# Patient Record
Sex: Male | Born: 1965 | Race: White | Hispanic: No | Marital: Single | Smoking: Never smoker
Health system: Southern US, Community
[De-identification: ages and names within clinical notes are randomized; demographics above are authoritative.]

---

## 2013-07-17 ENCOUNTER — Emergency Department: Payer: Self-pay | Admitting: Emergency Medicine

## 2014-12-06 IMAGING — CR DG CHEST 2V
1 series · 2 of 2 positions shown · non-contrast
Comparison: None.

CLINICAL DATA: Sinus congestion and productive cough; right-sided
rib pain.

EXAM:
CHEST  2 VIEW

[Series 1: w chest pa · 0.14mm/px · 2 of 2 slices shown]
[im 1/2]
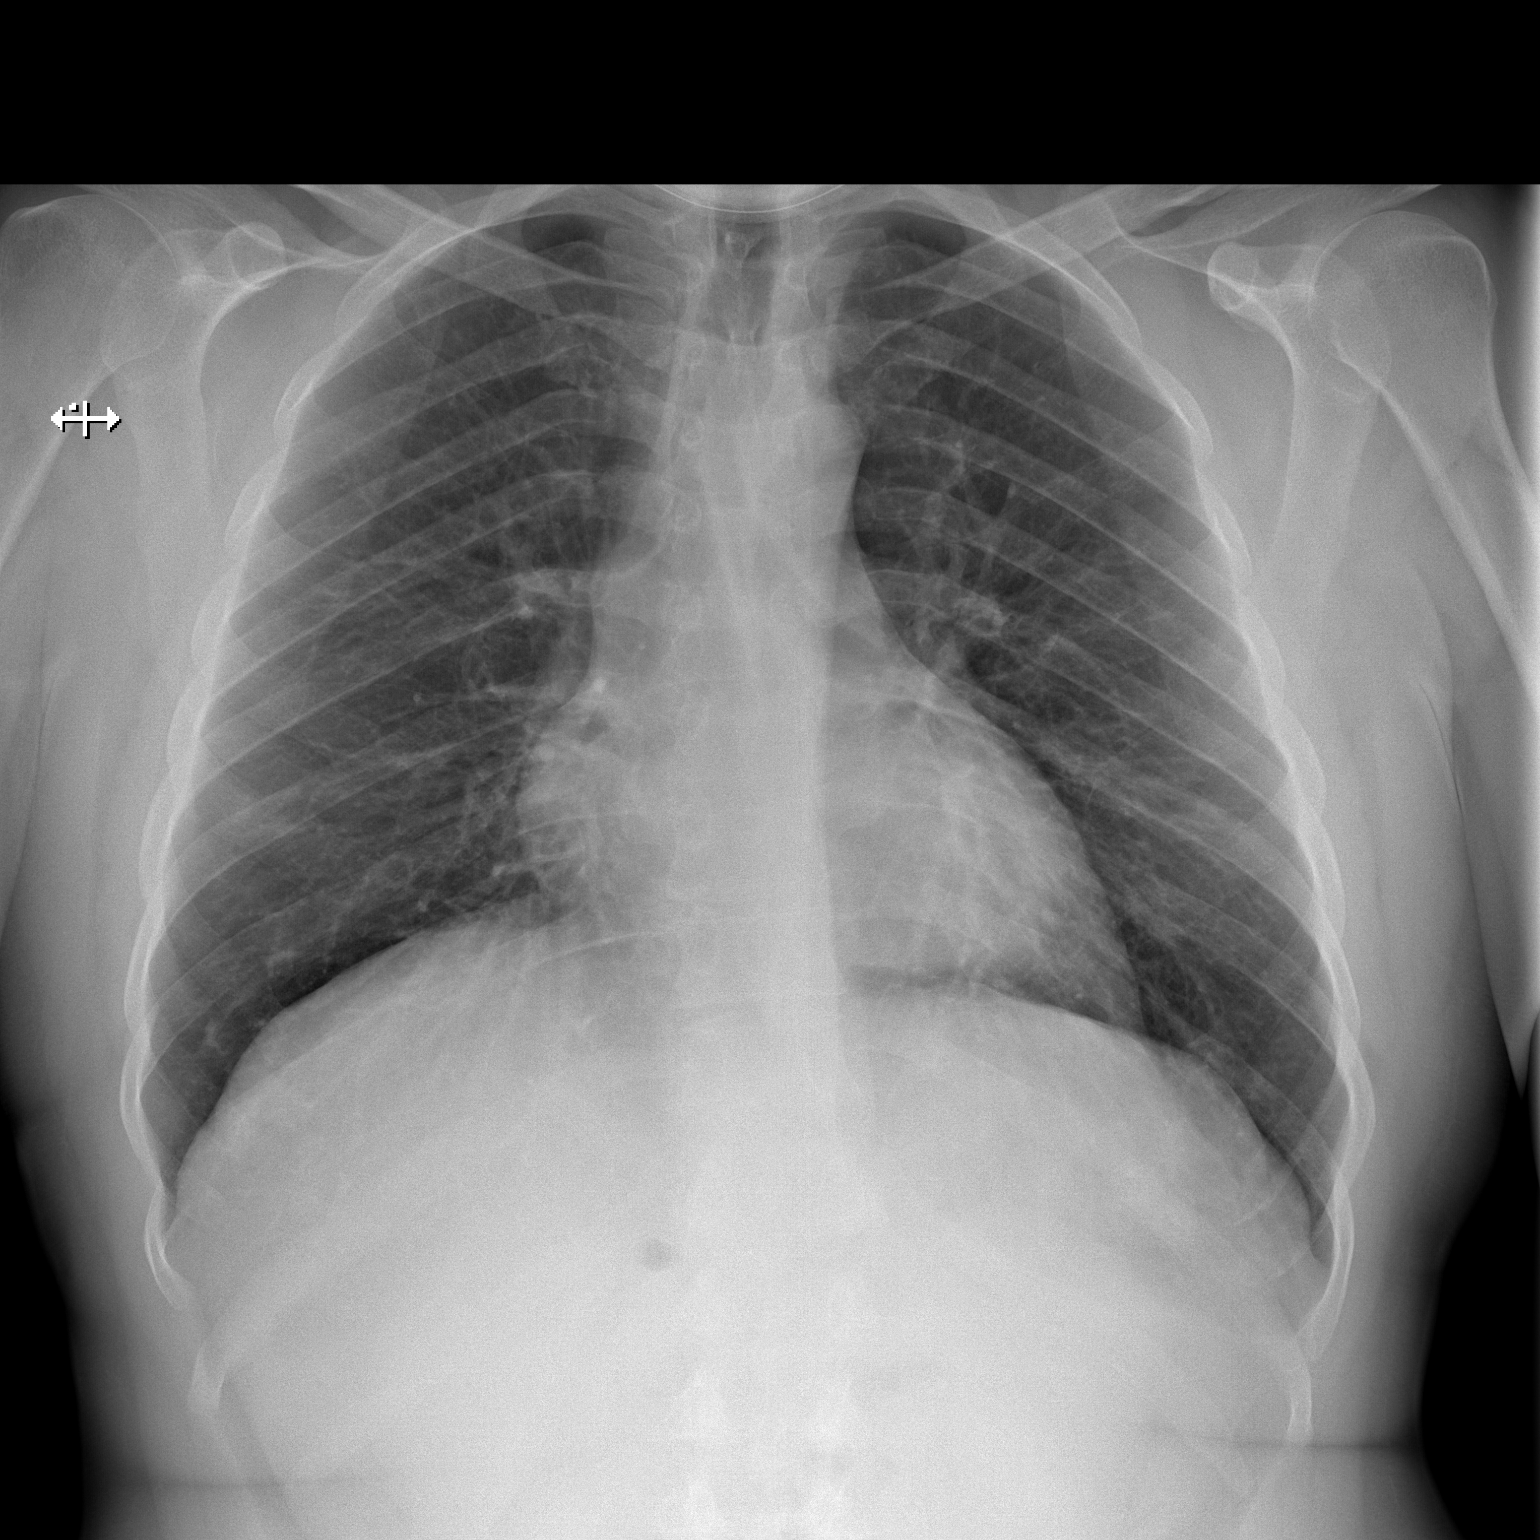
[im 2/2]
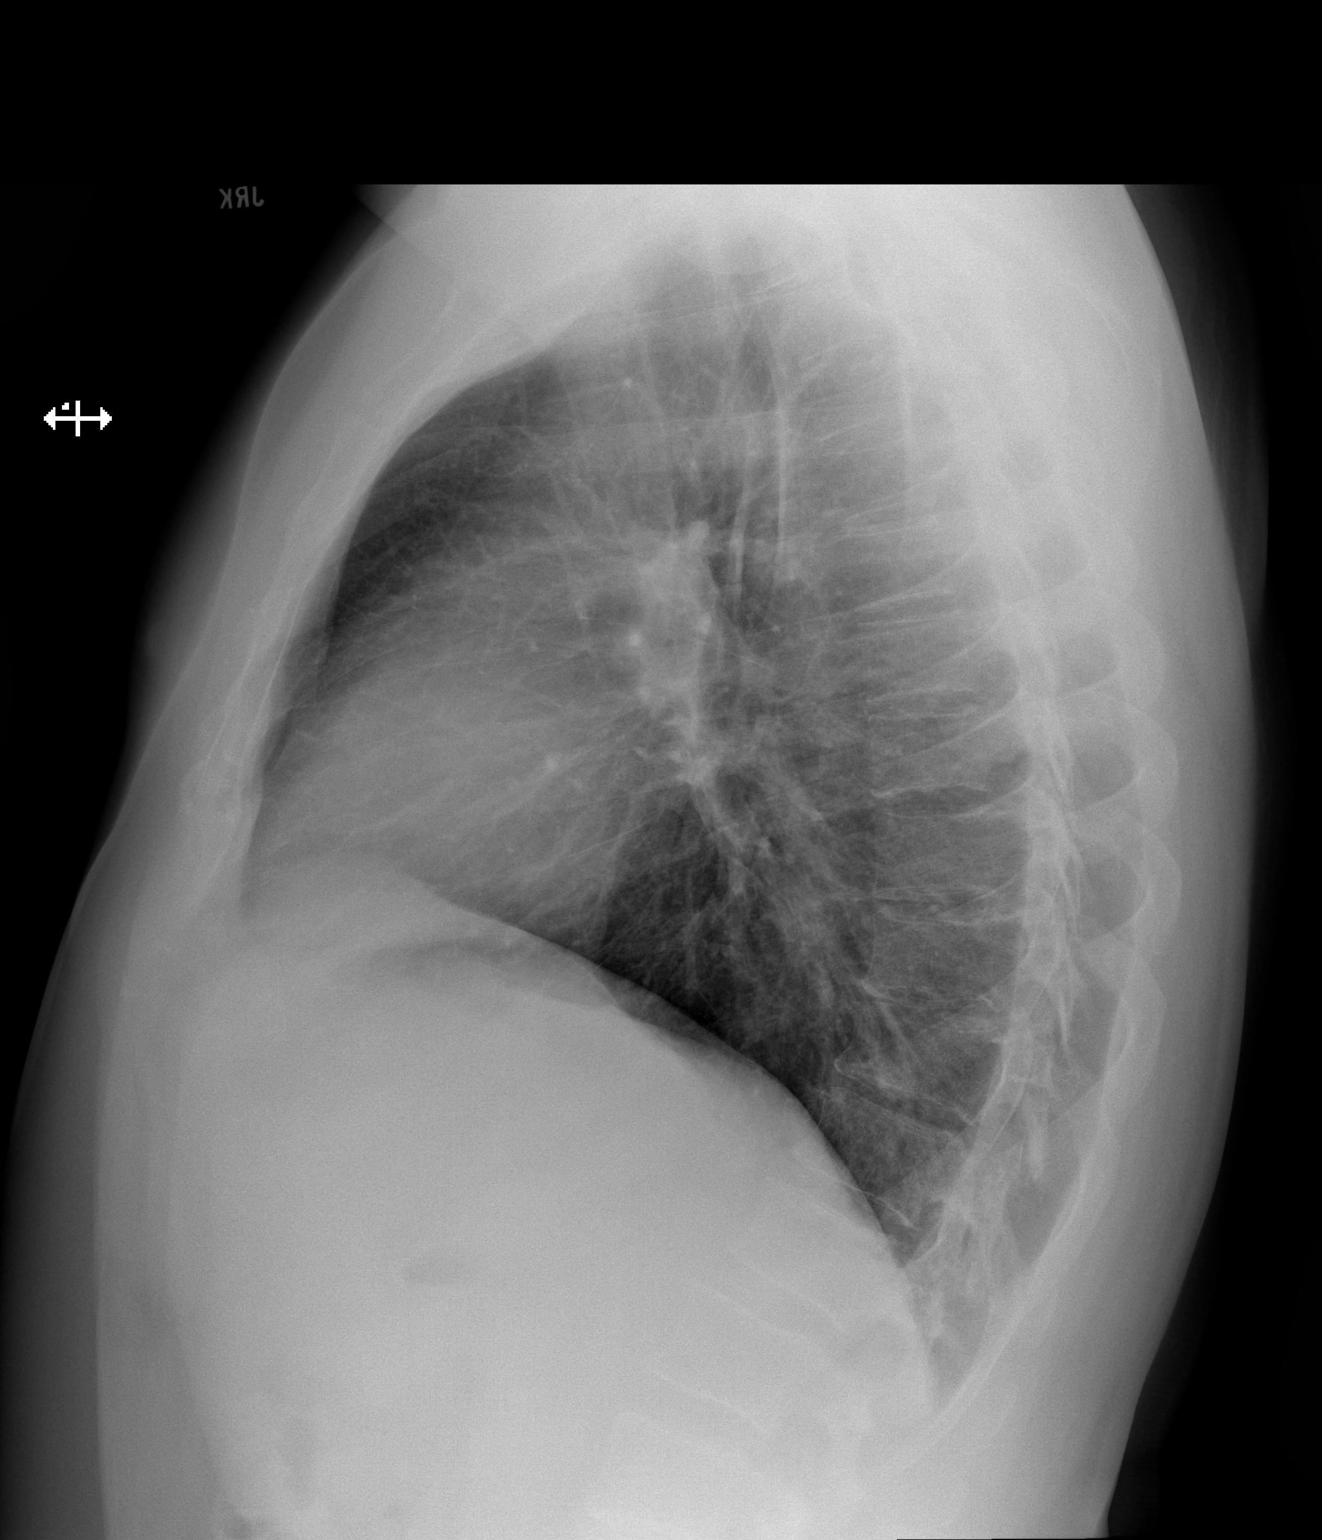

[2 of 2 positions shown; findings below may reference images not displayed]

FINDINGS: The lungs are well-aerated and clear. There is no evidence of focal
opacification, pleural effusion or pneumothorax.

The heart is normal in size; the mediastinal contour is within
normal limits. No acute osseous abnormalities are seen.
IMPRESSION: No acute cardiopulmonary process seen.

## 2019-02-05 ENCOUNTER — Emergency Department: Payer: Self-pay

## 2019-02-05 ENCOUNTER — Emergency Department
Admission: EM | Admit: 2019-02-05 | Discharge: 2019-02-05 | Disposition: A | Discharge status: Home / Self Care | Payer: Self-pay | Attending: Student in an Organized Health Care Education/Training Program | Admitting: Student in an Organized Health Care Education/Training Program

## 2019-02-05 ENCOUNTER — Other Ambulatory Visit: Payer: Self-pay

## 2019-02-05 DIAGNOSIS — Z79899 Other long term (current) drug therapy: Secondary | ICD-10-CM | POA: Insufficient documentation

## 2019-02-05 DIAGNOSIS — G510 Bell's palsy: Secondary | ICD-10-CM | POA: Insufficient documentation

## 2019-02-05 LAB — COMPREHENSIVE METABOLIC PANEL
ALT: 45 U/L — ABNORMAL HIGH (ref 0–44)
AST: 24 U/L (ref 15–41)
Albumin: 4.4 g/dL (ref 3.5–5.0)
Alkaline Phosphatase: 53 U/L (ref 38–126)
Anion gap: 10 (ref 5–15)
BUN: 22 mg/dL — ABNORMAL HIGH (ref 6–20)
CO2: 23 mmol/L (ref 22–32)
Calcium: 9 mg/dL (ref 8.9–10.3)
Chloride: 104 mmol/L (ref 98–111)
Creatinine, Ser: 1.23 mg/dL (ref 0.61–1.24)
GFR calc Af Amer: >60 mL/min (ref >60)
GFR calc non Af Amer: >60 mL/min (ref >60)
Glucose, Bld: 115 mg/dL — ABNORMAL HIGH (ref 70–99)
Potassium: 3.9 mmol/L (ref 3.5–5.1)
Sodium: 137 mmol/L (ref 135–145)
Total Bilirubin: 0.7 mg/dL (ref 0.3–1.2)
Total Protein: 7.7 g/dL (ref 6.5–8.1)

## 2019-02-05 LAB — ETHANOL: Alcohol, Ethyl (B): <10 mg/dL (ref <10)

## 2019-02-05 LAB — URINALYSIS, ROUTINE W REFLEX MICROSCOPIC
Bilirubin Urine: NEGATIVE
Glucose, UA: NEGATIVE mg/dL
Hgb urine dipstick: NEGATIVE
Ketones, ur: NEGATIVE mg/dL
Leukocytes,Ua: NEGATIVE
Nitrite: NEGATIVE
Protein, ur: NEGATIVE mg/dL
Specific Gravity, Urine: 1.024 (ref 1.005–1.030)
pH: 5 (ref 5.0–8.0)

## 2019-02-05 LAB — URINE DRUG SCREEN, QUALITATIVE (ARMC ONLY)
Amphetamines, Ur Screen: NOT DETECTED
Barbiturates, Ur Screen: NOT DETECTED
Benzodiazepine, Ur Scrn: NOT DETECTED
Cannabinoid 50 Ng, Ur ~~LOC~~: NOT DETECTED
Cocaine Metabolite,Ur ~~LOC~~: NOT DETECTED
MDMA (Ecstasy)Ur Screen: NOT DETECTED
Methadone Scn, Ur: NOT DETECTED
Opiate, Ur Screen: NOT DETECTED
Phencyclidine (PCP) Ur S: NOT DETECTED
Tricyclic, Ur Screen: NOT DETECTED

## 2019-02-05 LAB — DIFFERENTIAL
Abs Immature Granulocytes: 0.04 10*3/uL (ref 0.00–0.07)
Basophils Absolute: 0.1 10*3/uL (ref 0.0–0.1)
Basophils Relative: 1 %
Eosinophils Absolute: 0.3 10*3/uL (ref 0.0–0.5)
Eosinophils Relative: 3 %
Immature Granulocytes: 0 %
Lymphocytes Relative: 19 %
Lymphs Abs: 1.8 10*3/uL (ref 0.7–4.0)
Monocytes Absolute: 0.7 10*3/uL (ref 0.1–1.0)
Monocytes Relative: 7 %
Neutro Abs: 6.7 10*3/uL (ref 1.7–7.7)
Neutrophils Relative %: 70 %

## 2019-02-05 LAB — PROTIME-INR
INR: 0.9 (ref 0.8–1.2)
Prothrombin Time: 12 seconds (ref 11.4–15.2)

## 2019-02-05 LAB — CBC
HCT: 45.2 % (ref 39.0–52.0)
Hemoglobin: 15.3 g/dL (ref 13.0–17.0)
MCH: 29.8 pg (ref 26.0–34.0)
MCHC: 33.8 g/dL (ref 30.0–36.0)
MCV: 88.1 fL (ref 80.0–100.0)
Platelets: 328 10*3/uL (ref 150–400)
RBC: 5.13 MIL/uL (ref 4.22–5.81)
RDW: 13.3 % (ref 11.5–15.5)
WBC: 9.6 10*3/uL (ref 4.0–10.5)
nRBC: 0 % (ref 0.0–0.2)

## 2019-02-05 LAB — APTT: aPTT: 26 seconds (ref 24–36)

## 2019-02-05 MED ORDER — DOXYCYCLINE HYCLATE 100 MG PO TABS
100.0000 mg | ORAL_TABLET | Freq: Once | ORAL | Status: AC
  Administered 2019-02-05: 100 mg via ORAL
  Filled 2019-02-05: qty 1

## 2019-02-05 MED ORDER — VALACYCLOVIR HCL 1 G PO TABS: 1000.0000 mg | ORAL_TABLET | Freq: Three times a day (TID) | ORAL | 0 refills | Status: AC

## 2019-02-05 MED ORDER — PREDNISONE 10 MG PO TABS: 10.0000 mg | ORAL_TABLET | Freq: Every day | ORAL | 0 refills | Status: AC

## 2019-02-05 MED ORDER — ARTIFICIAL TEARS OPHTHALMIC OINT: TOPICAL_OINTMENT | Freq: Every evening | OPHTHALMIC | 1 refills | Status: AC | PRN

## 2019-02-05 MED ORDER — DOXYCYCLINE HYCLATE 100 MG PO TABS: 100.0000 mg | ORAL_TABLET | Freq: Two times a day (BID) | ORAL | 0 refills | Status: AC

## 2019-02-05 NOTE — ED Triage Notes (Signed)
Pt states he woke up 2 days ago with left facial droop, denies any other sx.

## 2019-02-05 NOTE — ED Provider Notes (Signed)
[facility] Emergency Department Provider Note    First MD Initiated Contact with Patient 02/05/19 [phone]     (approximate)  I have reviewed the triage vital signs and the nursing notes.   HISTORY  Chief Complaint Facial Droop    HPI Joseph Mendoza is a 53 y.o. male no significant past medical history presents the ER with several days of left-sided facial droop and weakness.  Denies any blurry vision.  No pain.  States that he thinks that his Bell's palsy and his father had a history of Bell's palsy.  States that he is worried that he got up from tick bites that he had multiple over the past few weeks.  Has not noted any rashes.  No chest pain or shortness of breath.  No numbness or tingling.  Denies any vision or auditory loss.    History reviewed. No pertinent past medical history. No family history on file. History reviewed. No pertinent surgical history. There are no active problems to display for this patient.     Prior to Admission medications   Medication Sig Start Date End Date Taking? Authorizing Provider  artificial tears (LACRILUBE) OINT ophthalmic ointment Place into both eyes at bedtime as needed for dry eyes. 02/05/19   Merlyn Lot, MD  doxycycline (VIBRA-TABS) 100 MG tablet Take 1 tablet (100 mg total) by mouth 2 (two) times daily for 7 days. 02/05/19 02/12/19  Merlyn Lot, MD  predniSONE (DELTASONE) 10 MG tablet Take 1 tablet (10 mg total) by mouth daily. Day 1-2: Take 5pills Day 3-4 : Take 4pills Day 5-6: Take 3pills Day 7-8: Take 2  Day 9: Take 1 02/05/19   Merlyn Lot, MD  valACYclovir (VALTREX) 1000 MG tablet Take 1 tablet (1,000 mg total) by mouth 3 (three) times daily for 7 days. 02/05/19 02/12/19  Merlyn Lot, MD    Allergies Shellfish allergy    Social History Social History   Tobacco Use  . Smoking status: Never Smoker  . Smokeless tobacco: Never Used  Substance Use Topics  . Alcohol use: Not  Currently  . Drug use: Not Currently    Review of Systems Patient denies headaches, rhinorrhea, blurry vision, numbness, shortness of breath, chest pain, edema, cough, abdominal pain, nausea, vomiting, diarrhea, dysuria, fevers, rashes or hallucinations unless otherwise stated above in HPI. ____________________________________________   PHYSICAL EXAM:  VITAL SIGNS: Vitals:   02/05/19 0829 02/05/19 0920  BP: (!) 158/100 (!) 145/100  Pulse: 74 75  Resp: 20 16  Temp: 98.2 F (36.8 C)   SpO2: 97% 98%    Constitutional: Alert and oriented.  Eyes: Conjunctivae are normal.  Head: Atraumatic. Nose: No congestion/rhinnorhea. Mouth/Throat: Mucous membranes are moist.   Neck: No stridor. Painless ROM.  Cardiovascular: Normal rate, regular rhythm. Grossly normal heart sounds.  Good peripheral circulation. Respiratory: Normal respiratory effort.  No retractions. Lungs CTAB. Gastrointestinal: Soft and nontender. No distention. No abdominal bruits. No CVA tenderness. Genitourinary:  Musculoskeletal: No lower extremity tenderness nor edema.  No joint effusions. Neurologic:  Complete left facial weakness involving forehead with droop, unable to close left eye.  Sensation intact.  Otherwise remaining CN- intact.   Normal FNF.  Normal heel to shin.  Sensation intact bilaterally. Normal speech and language.  No gait instability. Skin:  No rash noted. Psychiatric: Mood and affect are normal. Speech and behavior are normal.  ____________________________________________   LABS (all labs ordered are listed, but only abnormal results are displayed)  Results for orders placed  or performed during the hospital encounter of 02/05/19 (from the past 24 hour(s))  Ethanol     Status: None   Collection Time: 02/05/19  8:53 AM  Result Value Ref Range   Alcohol, Ethyl (B) <10 <10 mg/dL  Protime-INR     Status: None   Collection Time: 02/05/19  8:53 AM  Result Value Ref Range   Prothrombin Time 12.0  11.4 - 15.2 seconds   INR 0.9 0.8 - 1.2  APTT     Status: None   Collection Time: 02/05/19  8:53 AM  Result Value Ref Range   aPTT 26 24 - 36 seconds  CBC     Status: None   Collection Time: 02/05/19  8:53 AM  Result Value Ref Range   WBC 9.6 4.0 - 10.5 K/uL   RBC 5.13 4.22 - 5.81 MIL/uL   Hemoglobin 15.3 13.0 - 17.0 g/dL   HCT 16.145.2 09.639.0 - 04.552.0 %   MCV 88.1 80.0 - 100.0 fL   MCH 29.8 26.0 - 34.0 pg   MCHC 33.8 30.0 - 36.0 g/dL   RDW 40.913.3 81.111.5 - 91.415.5 %   Platelets 328 150 - 400 K/uL   nRBC 0.0 0.0 - 0.2 %  Differential     Status: None   Collection Time: 02/05/19  8:53 AM  Result Value Ref Range   Neutrophils Relative % 70 %   Neutro Abs 6.7 1.7 - 7.7 K/uL   Lymphocytes Relative 19 %   Lymphs Abs 1.8 0.7 - 4.0 K/uL   Monocytes Relative 7 %   Monocytes Absolute 0.7 0.1 - 1.0 K/uL   Eosinophils Relative 3 %   Eosinophils Absolute 0.3 0.0 - 0.5 K/uL   Basophils Relative 1 %   Basophils Absolute 0.1 0.0 - 0.1 K/uL   Immature Granulocytes 0 %   Abs Immature Granulocytes 0.04 0.00 - 0.07 K/uL  Comprehensive metabolic panel     Status: Abnormal   Collection Time: 02/05/19  8:53 AM  Result Value Ref Range   Sodium 137 135 - 145 mmol/L   Potassium 3.9 3.5 - 5.1 mmol/L   Chloride 104 98 - 111 mmol/L   CO2 23 22 - 32 mmol/L   Glucose, Bld 115 (H) 70 - 99 mg/dL   BUN 22 (H) 6 - 20 mg/dL   Creatinine, Ser 7.821.23 0.61 - 1.24 mg/dL   Calcium 9.0 8.9 - 95.610.3 mg/dL   Total Protein 7.7 6.5 - 8.1 g/dL   Albumin 4.4 3.5 - 5.0 g/dL   AST 24 15 - 41 U/L   ALT 45 (H) 0 - 44 U/L   Alkaline Phosphatase 53 38 - 126 U/L   Total Bilirubin 0.7 0.3 - 1.2 mg/dL   GFR calc non Af Amer >60 >60 mL/min   GFR calc Af Amer >60 >60 mL/min   Anion gap 10 5 - 15  Urine Drug Screen, Qualitative     Status: None   Collection Time: 02/05/19  9:05 AM  Result Value Ref Range   Tricyclic, Ur Screen NONE DETECTED NONE DETECTED   Amphetamines, Ur Screen NONE DETECTED NONE DETECTED   MDMA (Ecstasy)Ur Screen  NONE DETECTED NONE DETECTED   Cocaine Metabolite,Ur [place] NONE DETECTED NONE DETECTED   Opiate, Ur Screen NONE DETECTED NONE DETECTED   Phencyclidine (PCP) Ur S NONE DETECTED NONE DETECTED   Cannabinoid 50 Ng, Ur [place] NONE DETECTED NONE DETECTED   Barbiturates, Ur Screen NONE DETECTED NONE DETECTED   Benzodiazepine, Ur Scrn NONE DETECTED  NONE DETECTED   Methadone Scn, Ur NONE DETECTED NONE DETECTED  Urinalysis, Routine w reflex microscopic     Status: Abnormal   Collection Time: 02/05/19  9:05 AM  Result Value Ref Range   Color, Urine YELLOW (A) YELLOW   APPearance HAZY (A) CLEAR   Specific Gravity, Urine 1.024 1.005 - 1.030   pH 5.0 5.0 - 8.0   Glucose, UA NEGATIVE NEGATIVE mg/dL   Hgb urine dipstick NEGATIVE NEGATIVE   Bilirubin Urine NEGATIVE NEGATIVE   Ketones, ur NEGATIVE NEGATIVE mg/dL   Protein, ur NEGATIVE NEGATIVE mg/dL   Nitrite NEGATIVE NEGATIVE   Leukocytes,Ua NEGATIVE NEGATIVE   ____________________________________________  EKG My review and personal interpretation at Time: 9:19   Indication: facial droop  Rate: 70  Rhythm: sinus Axis: normal Other: normal intervals, no stemi ____________________________________________  RADIOLOGY   ____________________________________________   PROCEDURES  Procedure(s) performed:  Procedures    Critical Care performed: no ____________________________________________   INITIAL IMPRESSION / ASSESSMENT AND PLAN / ED COURSE  Pertinent labs & imaging results that were available during my care of the patient were reviewed by me and considered in my medical decision making (see chart for details).   DDX: bells palsy, neuornitis, cva, tick borne illness  Joseph Mendoza is a 53 y.o. who presents to the ED with symptoms as described above.  Patient well-appearing.  Certainly outside of the window for stroke and his presentation is consistent with Bell's palsy.  Given significance of weakness will treat with prednisone and  antiviral however given recent tick exposure I think it is also reasonable to cover with doxycycline.  Discussed eye protection and need for eyedrops at night.  Discussed follow-up with PCP.  This not consistent with CVA.  Blood work sent for the by differential was reassuring.  Stable and appropriate for outpatient follow-up.     The patient was evaluated in Emergency Department today for the symptoms described in the history of present illness. He/she was evaluated in the context of the global COVID-19 pandemic, which necessitated consideration that the patient might be at risk for infection with the SARS-CoV-2 virus that causes COVID-19. Institutional protocols and algorithms that pertain to the evaluation of patients at risk for COVID-19 are in a state of rapid change based on information released by regulatory bodies including the CDC and federal and state organizations. These policies and algorithms were followed during the patient's care in the ED.  As part of my medical decision making, I reviewed the following data within the electronic MEDICAL RECORD NUMBER Nursing notes reviewed and incorporated, Labs reviewed, notes from prior ED visits and [place] Controlled Substance Database   ____________________________________________   FINAL CLINICAL IMPRESSION(S) / ED DIAGNOSES  Final diagnoses:  Bell's palsy      NEW MEDICATIONS STARTED DURING THIS VISIT:  Discharge Medication List as of 02/05/2019  9:31 AM    START taking these medications   Details  artificial tears (LACRILUBE) OINT ophthalmic ointment Place into both eyes at bedtime as needed for dry eyes., Starting Sat 02/05/2019, Print    doxycycline (VIBRA-TABS) 100 MG tablet Take 1 tablet (100 mg total) by mouth 2 (two) times daily for 7 days., Starting Sat 02/05/2019, Until Sat 02/12/2019, Normal    predniSONE (DELTASONE) 10 MG tablet Take 1 tablet (10 mg total) by mouth daily. Day 1-2: Take 5pills Day 3-4 : Take 4pills Day 5-6: Take 3pills  Day 7-8: Take 2  Day 9: Take 1, Starting Sat 02/05/2019, Normal  valACYclovir (VALTREX) 1000 MG tablet Take 1 tablet (1,000 mg total) by mouth 3 (three) times daily for 7 days., Starting Sat 02/05/2019, Until Sat 02/12/2019, Normal         Note:  This document was prepared using Dragon voice recognition software and may include unintentional dictation errors.    Willy Eddyobinson, Lynnex Fulp, MD 02/05/19 [id number]

## 2019-02-05 NOTE — ED Notes (Signed)
Facial droop x2 days and difficulty speaking- also has a h/a and pain in the jaw on the right side
